# Patient Record
Sex: Female | Born: 1950 | Race: Black or African American | Hispanic: No | Marital: Married
Health system: Southern US, Community
[De-identification: ages and names within clinical notes are randomized; demographics above are authoritative.]

## PROBLEM LIST (undated history)

## (undated) DIAGNOSIS — I10 Essential (primary) hypertension: Secondary | ICD-10-CM

## (undated) DIAGNOSIS — E7800 Pure hypercholesterolemia, unspecified: Secondary | ICD-10-CM

## (undated) DIAGNOSIS — E119 Type 2 diabetes mellitus without complications: Secondary | ICD-10-CM

## (undated) HISTORY — PX: BREAST SURGERY: SHX581

## (undated) HISTORY — PX: TUBAL LIGATION: SHX77

---

## 2020-01-10 ENCOUNTER — Other Ambulatory Visit: Payer: Self-pay

## 2020-01-10 ENCOUNTER — Ambulatory Visit (INDEPENDENT_AMBULATORY_CARE_PROVIDER_SITE_OTHER): Payer: Medicare HMO

## 2020-01-10 ENCOUNTER — Ambulatory Visit
Admission: EM | Admit: 2020-01-10 | Discharge: 2020-01-10 | Disposition: A | Discharge status: Home / Self Care | Payer: Medicare HMO | Attending: Family Medicine | Admitting: Family Medicine

## 2020-01-10 DIAGNOSIS — R0789 Other chest pain: Secondary | ICD-10-CM | POA: Insufficient documentation

## 2020-01-10 HISTORY — DX: Pure hypercholesterolemia, unspecified: E78.00

## 2020-01-10 HISTORY — DX: Type 2 diabetes mellitus without complications: E11.9

## 2020-01-10 HISTORY — DX: Essential (primary) hypertension: I10

## 2020-01-10 LAB — CBC WITH DIFFERENTIAL/PLATELET
Abs Immature Granulocytes: 0.04 10*3/uL (ref 0.00–0.07)
Basophils Absolute: 0 10*3/uL (ref 0.0–0.1)
Basophils Relative: 0 %
Eosinophils Absolute: 0.1 10*3/uL (ref 0.0–0.5)
Eosinophils Relative: 1 %
HCT: 40.1 % (ref 36.0–46.0)
Hemoglobin: 13.8 g/dL (ref 12.0–15.0)
Immature Granulocytes: 0 %
Lymphocytes Relative: 21 %
Lymphs Abs: 2.5 10*3/uL (ref 0.7–4.0)
MCH: 28.6 pg (ref 26.0–34.0)
MCHC: 34.4 g/dL (ref 30.0–36.0)
MCV: 83.2 fL (ref 80.0–100.0)
Monocytes Absolute: 0.9 10*3/uL (ref 0.1–1.0)
Monocytes Relative: 8 %
Neutro Abs: 8.3 10*3/uL — ABNORMAL HIGH (ref 1.7–7.7)
Neutrophils Relative %: 70 %
Platelets: 372 10*3/uL (ref 150–400)
RBC: 4.82 MIL/uL (ref 3.87–5.11)
RDW: 12.6 % (ref 11.5–15.5)
WBC: 11.8 10*3/uL — ABNORMAL HIGH (ref 4.0–10.5)
nRBC: 0 % (ref 0.0–0.2)

## 2020-01-10 LAB — COMPREHENSIVE METABOLIC PANEL
ALT: 13 U/L (ref 0–44)
AST: 16 U/L (ref 15–41)
Albumin: 4.6 g/dL (ref 3.5–5.0)
Alkaline Phosphatase: 87 U/L (ref 38–126)
Anion gap: 11 (ref 5–15)
BUN: 13 mg/dL (ref 8–23)
CO2: 24 mmol/L (ref 22–32)
Calcium: 10.3 mg/dL (ref 8.9–10.3)
Chloride: 104 mmol/L (ref 98–111)
Creatinine, Ser: 0.68 mg/dL (ref 0.44–1.00)
GFR calc Af Amer: >60 mL/min (ref >60)
GFR calc non Af Amer: >60 mL/min (ref >60)
Glucose, Bld: 98 mg/dL (ref 70–99)
Potassium: 3.5 mmol/L (ref 3.5–5.1)
Sodium: 139 mmol/L (ref 135–145)
Total Bilirubin: 0.7 mg/dL (ref 0.3–1.2)
Total Protein: 8.1 g/dL (ref 6.5–8.1)

## 2020-01-10 LAB — TROPONIN I (HIGH SENSITIVITY): Troponin I (High Sensitivity): 6 ng/L (ref <18)

## 2020-01-10 NOTE — Discharge Instructions (Signed)
Rest.  Tylenol 1000 mg three times daily as needed.  No lifting for the next few days.  Take care  Dr. Adriana Simas

## 2020-01-10 NOTE — ED Provider Notes (Signed)
[facility]    CSN: [id number] Arrival date & time: 01/10/20  1741      History   Chief Complaint Chief Complaint  Patient presents with  . Chest Pain   HPI  69 year old female with hypertension, diabetes, hyperlipidemia presents with chest pain.  Left-sided chest pain started today.  Described as a pressure or tightness.  Occurred while she was driving.  She states that it was constant for a while and then subsided.  It is now intermittent.  Started around 11:00 this morning.  Rates her pain as 6/10 in severity when the pain occurs.  No shortness of breath.  No diaphoresis.  No association with exertion.  She has taken Tums with some improvement.  No aspirin taken.  No other medications or interventions tried.  She states that it occurs predominantly when she takes a deep breath.  No associated nausea vomiting.  No other associated symptoms.  No other complaints.  Past Medical History:  Diagnosis Date  . Diabetes mellitus without complication ([place])   . High cholesterol   . Hypertension    Past Surgical History:  Procedure Laterality Date  . BREAST SURGERY    . TUBAL LIGATION      OB History   No obstetric history on file.      Home Medications    Prior to Admission medications   Medication Sig Start Date End Date Taking? Authorizing Provider  amLODipine (NORVASC) 5 MG tablet Take by mouth. 09/25/19  Yes [provider]  dapagliflozin propanediol (FARXIGA) 10 MG TABS tablet TAKE 1 TABLET EVERY MORNING 03/28/19  Yes [provider]  glimepiride (AMARYL) 2 MG tablet Take by mouth. 04/04/19 04/03/20 Yes [provider]  losartan (COZAAR) 50 MG tablet Take by mouth. 10/30/19 10/29/20 Yes [provider]  metFORMIN (GLUCOPHAGE) 1000 MG tablet Take 1,000 mg by mouth 2 (two) times daily with a meal.   Yes [provider]  pravastatin (PRAVACHOL) 40 MG tablet Take by mouth. 10/09/19 04/06/20 Yes [provider]   Social  History Social History   Tobacco Use  . Smoking status: Not on file  Substance Use Topics  . Alcohol use: Not on file  . Drug use: Not on file   Allergies   Patient has no known allergies.  Review of Systems Review of Systems  Constitutional: Negative for diaphoresis.  Respiratory: Negative for shortness of breath.   Cardiovascular: Positive for chest pain.  Gastrointestinal: Negative for nausea and vomiting.   Physical Exam Triage Vital Signs ED Triage Vitals  Enc Vitals Group     BP 01/10/20 1752 (!) 175/103     Pulse Rate 01/10/20 1752 (!) 108     Resp 01/10/20 1752 16     Temp 01/10/20 1752 98.1 F (36.7 C)     Temp src --      SpO2 01/10/20 1752 100 %     Weight --      Height --      Head Circumference --      Peak Flow --      Pain Score 01/10/20 1753 6     Pain Loc --      Pain Edu? --      Excl. in [place]? --    Updated Vital Signs BP (!) 175/103   Pulse (!) 108   Temp 98.1 F (36.7 C)   Resp 16   SpO2 100%   Visual Acuity Right Eye Distance:   Left Eye Distance:  Bilateral Distance:    Right Eye Near:   Left Eye Near:    Bilateral Near:     Physical Exam Vitals and nursing note reviewed.  Constitutional:      General: She is not in acute distress.    Appearance: Normal appearance. She is not ill-appearing.  HENT:     Head: Normocephalic and atraumatic.  Eyes:     General:        Right eye: No discharge.        Left eye: No discharge.     Conjunctiva/sclera: Conjunctivae normal.  Cardiovascular:     Rate and Rhythm: Normal rate and regular rhythm.     Heart sounds: No murmur.  Pulmonary:     Effort: Pulmonary effort is normal.     Breath sounds: Normal breath sounds. No wheezing, rhonchi or rales.  Neurological:     Mental Status: She is alert.  Psychiatric:        Mood and Affect: Mood normal.        Behavior: Behavior normal.    UC Treatments / Results  Labs (all labs ordered are listed, but only abnormal results are  displayed) Labs Reviewed  CBC WITH DIFFERENTIAL/PLATELET - Abnormal; Notable for the following components:      Result Value   WBC 11.8 (*)    Neutro Abs 8.3 (*)    All other components within normal limits  COMPREHENSIVE METABOLIC PANEL  TROPONIN I (HIGH SENSITIVITY)    EKG Interpretation: Normal sinus rhythm with rate of 95.  Normal axis.  Normal intervals.  No ST or T wave changes.  Unremarkable EKG.  Radiology DG Chest 2 View  Result Date: 01/10/2020 CLINICAL DATA:  Chest pain EXAM: CHEST - 2 VIEW COMPARISON:  None. FINDINGS: The heart size and mediastinal contours are within normal limits. Both lungs are clear. The visualized skeletal structures are unremarkable. IMPRESSION: No active cardiopulmonary disease. Electronically Signed   By: Deatra Robinson M.D.   On: 01/10/2020 18:50    Procedures Procedures (including critical care time)  Medications Ordered in UC Medications - No data to display  Initial Impression / Assessment and Plan / UC Course  I have reviewed the triage vital signs and the nursing notes.  Pertinent labs & imaging results that were available during my care of the patient were reviewed by me and considered in my medical decision making (see chart for details).    69 year old female presents chest pain. Atypical. EKG nonischemic. Labs unremarkable including a negative troponin.  Suspect musculoskeletal etiology.  Tylenol as needed.  Supportive care.  Final Clinical Impressions(s) / UC Diagnoses   Final diagnoses:  Atypical chest pain     Discharge Instructions     Rest.  Tylenol 1000 mg three times daily as needed.  No lifting for the next few days.  Take care  Dr. Adriana Simas     ED Prescriptions    None     PDMP not reviewed this encounter.   Tommie Sams, [place] 01/10/20 1923

## 2020-01-10 NOTE — ED Triage Notes (Signed)
Pt c/o left sided chest pressure, worse with deep breathing. Pain intermittent x 7 hours.

## 2020-08-05 IMAGING — CR DG CHEST 2V
2 series · 2 of 2 positions shown · non-contrast
Comparison: None.

CLINICAL DATA: Chest pain

EXAM:
CHEST - 2 VIEW

[chest pa]
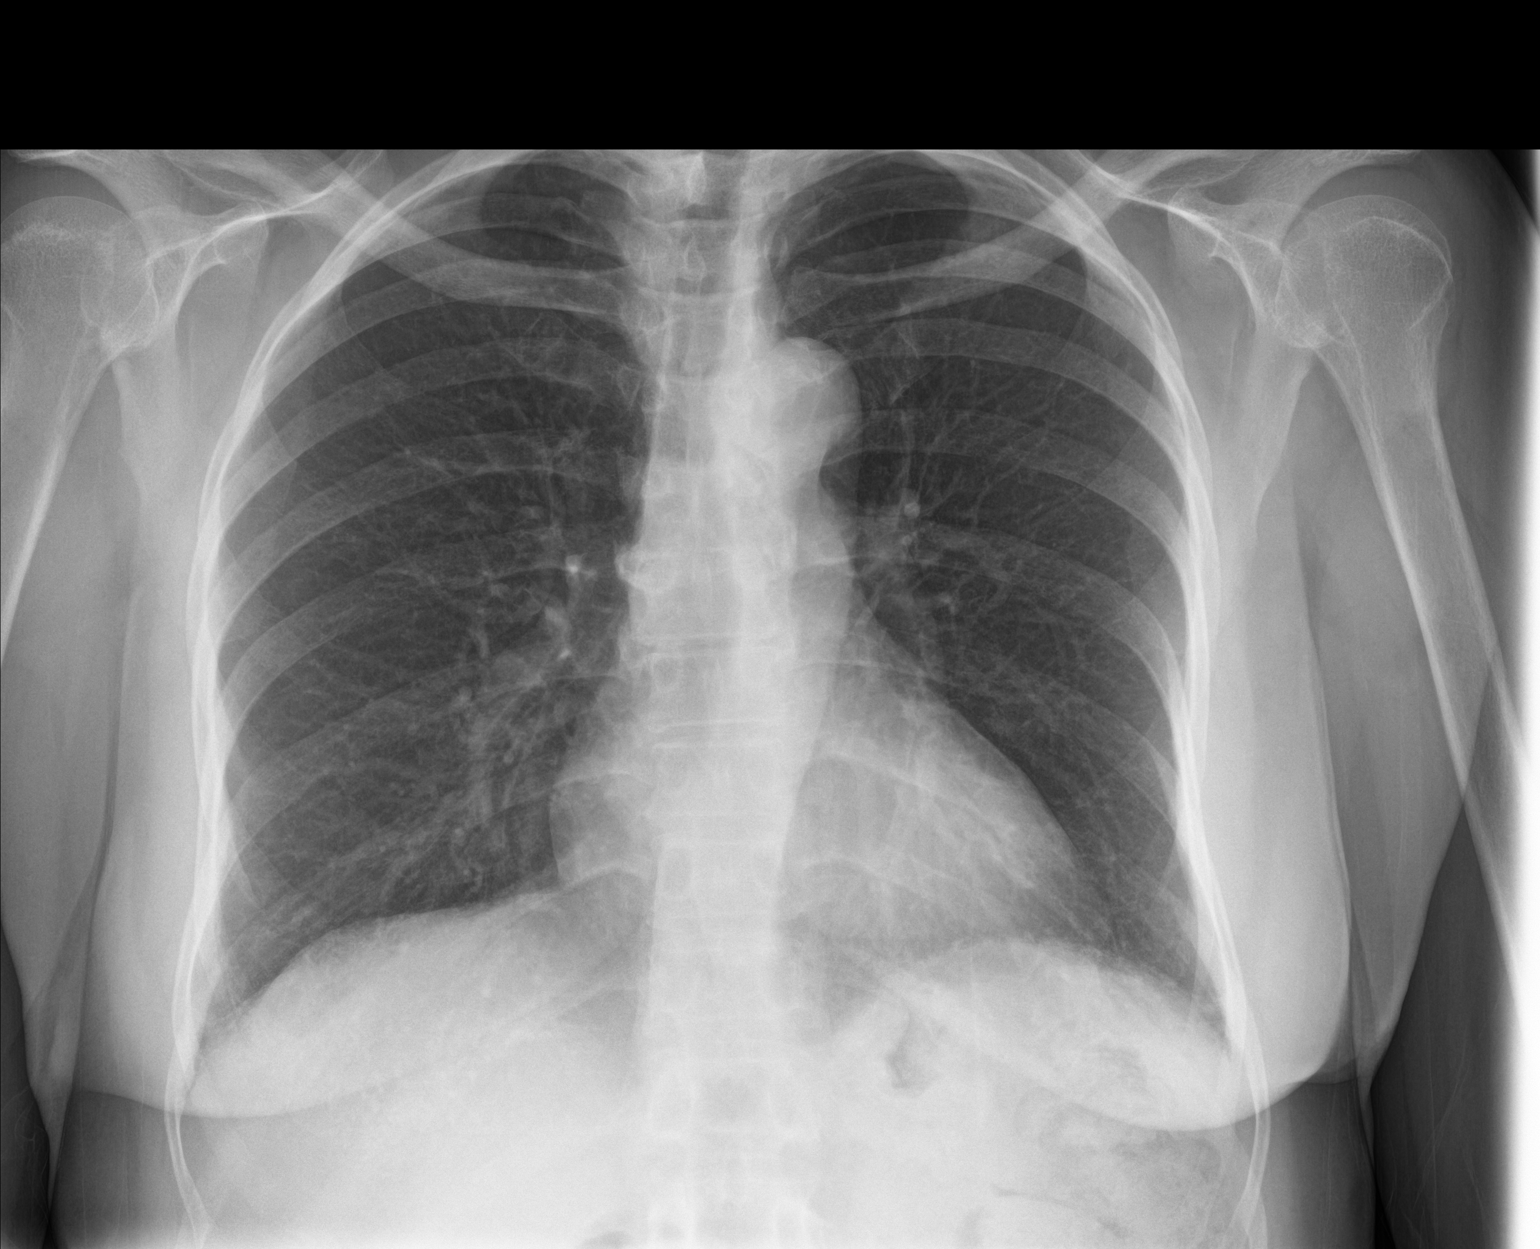

[chest lat]
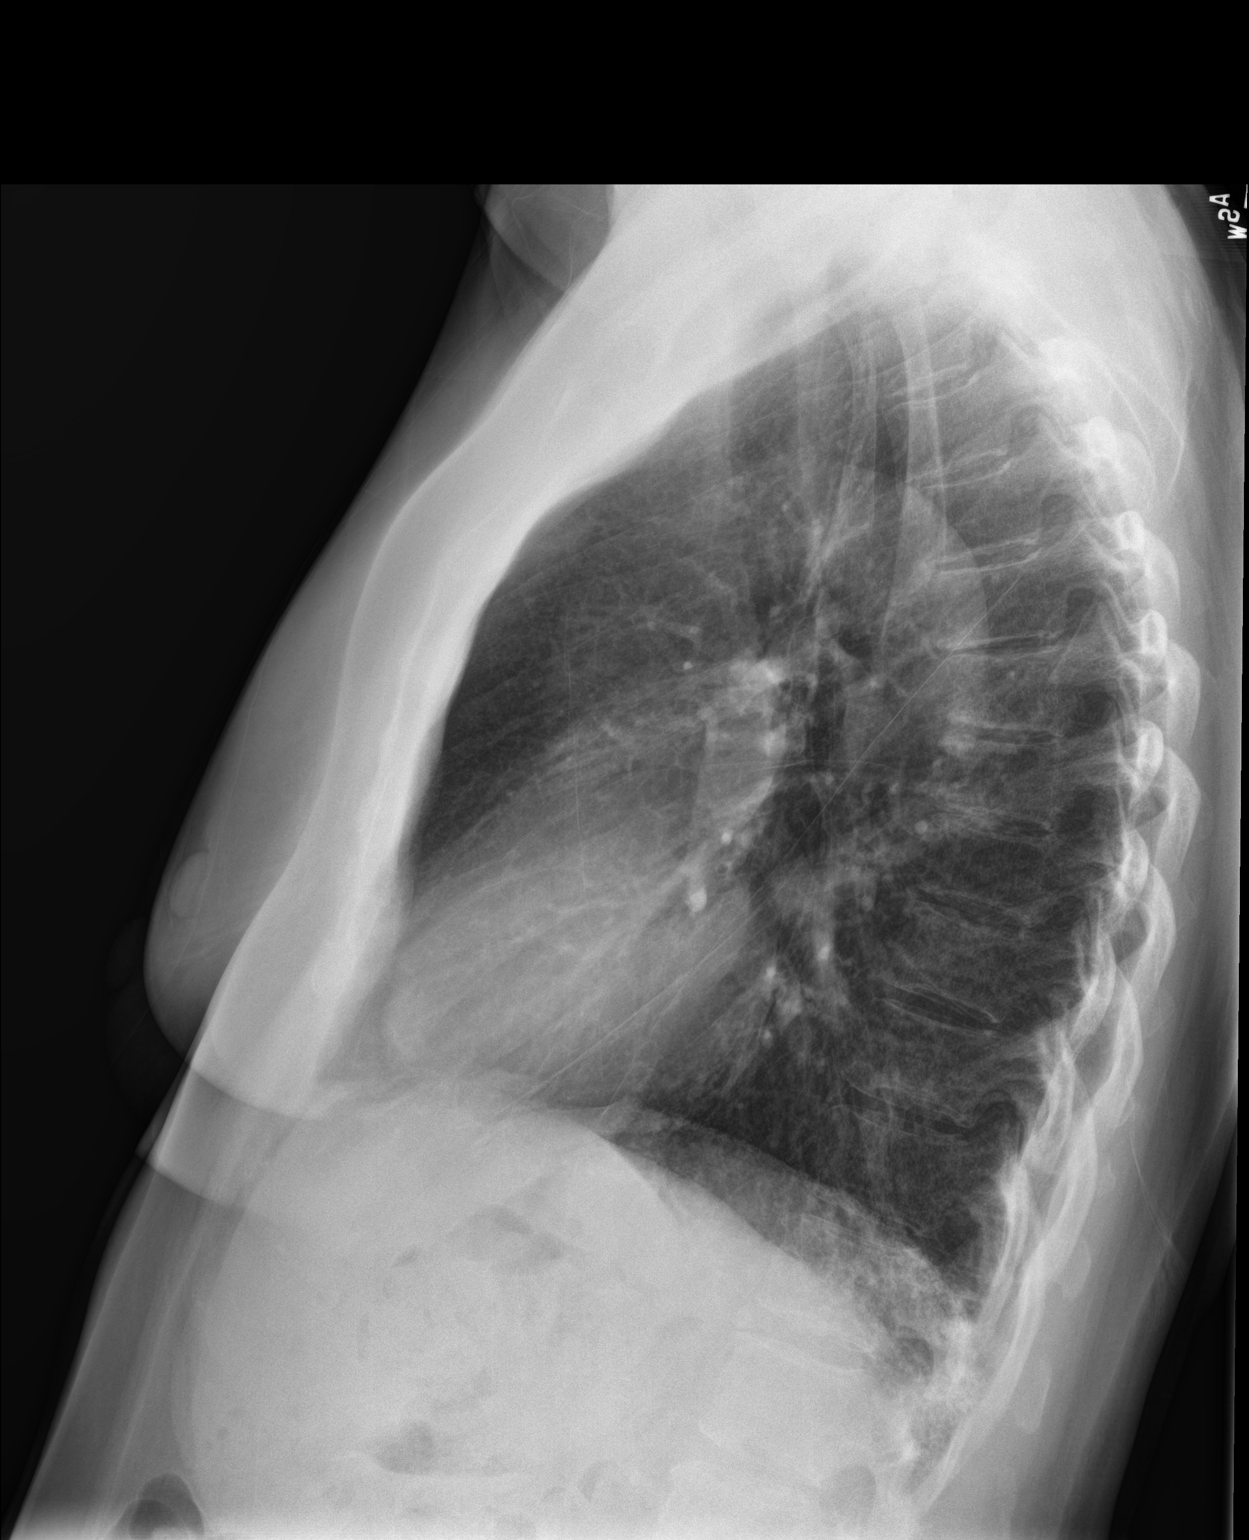

[2 of 2 positions shown; findings below may reference images not displayed]

FINDINGS: The heart size and mediastinal contours are within normal limits.
Both lungs are clear. The visualized skeletal structures are
unremarkable.
IMPRESSION: No active cardiopulmonary disease.

## 2022-01-14 ENCOUNTER — Ambulatory Visit (LOCAL_COMMUNITY_HEALTH_CENTER): Payer: Medicare HMO

## 2022-01-14 DIAGNOSIS — Z23 Encounter for immunization: Secondary | ICD-10-CM | POA: Diagnosis not present

## 2022-01-14 DIAGNOSIS — Z719 Counseling, unspecified: Secondary | ICD-10-CM

## 2022-01-14 NOTE — Progress Notes (Signed)
  Are you feeling sick today? No   Have you ever received a dose of COVID-19 Vaccine? [employer], [place], [place], [place], Other) Yes  If yes, which vaccine and how many doses?   4 - Moderna   Did you bring the vaccination record card or other documentation?  Yes   Do you have a health condition or are undergoing treatment that makes you moderately or severely immunocompromised? This would include, but not be limited to: cancer, HIV, organ transplant, immunosuppressive therapy/high-dose corticosteroids, or moderate/severe primary immunodeficiency.  No  Have you received COVID-19 vaccine before or during hematopoietic cell transplant (HCT) or CAR-T-cell therapies? No  Have you ever had an allergic reaction to: (This would include a severe allergic reaction or a reaction that caused hives, swelling, or respiratory distress, including wheezing.) A component of a COVID-19 vaccine or a previous dose of COVID-19 vaccine? No   Have you ever had an allergic reaction to another vaccine (other thanCOVID-19 vaccine) or an injectable medication? (This would include a severe allergic reaction or a reaction that caused hives, swelling, or respiratory distress, including wheezing.)   No    Do you have a history of any of the following:  Myocarditis or Pericarditis No  Dermal fillers:  No  Multisystem Inflammatory Syndrome (MIS-C or MIS-A)? No  COVID-19 disease within the past 3 months? No  Vaccinated with monkeypox vaccine in the last 4 weeks? No  Patient seen in Nurse's clinic today for COVID vaccine.  Moderna BV +12Y administered in right deltoid.  Tolerated well. VIS provided. COVD card updated and [facility] updated and copy provided to patient.

## 2023-05-09 ENCOUNTER — Ambulatory Visit: Payer: Medicare HMO

## 2023-05-09 DIAGNOSIS — Z719 Counseling, unspecified: Secondary | ICD-10-CM

## 2023-05-09 DIAGNOSIS — Z23 Encounter for immunization: Secondary | ICD-10-CM

## 2023-05-09 NOTE — Progress Notes (Signed)
Patient seen in nurse clinic for COVID vaccine. Spikevax Fall 2024-25 IM right deltoid. Tolerated well. VIS provided. [facility] updated and 1 copy provided.

## 2023-05-10 ENCOUNTER — Ambulatory Visit: Payer: Medicare HMO
